# Patient Record
Sex: Female | Born: 1990 | Race: White | Hispanic: No | Marital: Single | Smoking: Former smoker
Health system: Southern US, Community
[De-identification: ages and names within clinical notes are randomized; demographics above are authoritative.]

## PROBLEM LIST (undated history)

## (undated) DIAGNOSIS — J45909 Unspecified asthma, uncomplicated: Secondary | ICD-10-CM

## (undated) HISTORY — PX: APPENDECTOMY: SHX54

---
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
## 2015-07-08 ENCOUNTER — Encounter (HOSPITAL_COMMUNITY): Payer: Self-pay | Admitting: Emergency Medicine

## 2015-07-08 ENCOUNTER — Emergency Department (HOSPITAL_COMMUNITY)
Admission: EM | Admit: 2015-07-08 | Discharge: 2015-07-08 | Disposition: A | Discharge status: Home / Self Care | Payer: BLUE CROSS/BLUE SHIELD | Attending: Emergency Medicine | Admitting: Emergency Medicine

## 2015-07-08 DIAGNOSIS — R21 Rash and other nonspecific skin eruption: Secondary | ICD-10-CM

## 2015-07-08 DIAGNOSIS — J45909 Unspecified asthma, uncomplicated: Secondary | ICD-10-CM | POA: Insufficient documentation

## 2015-07-08 DIAGNOSIS — Z87891 Personal history of nicotine dependence: Secondary | ICD-10-CM | POA: Insufficient documentation

## 2015-07-08 HISTORY — DX: Unspecified asthma, uncomplicated: J45.909

## 2015-07-08 MED ORDER — PREDNISONE 20 MG PO TABS
40.0000 mg | ORAL_TABLET | Freq: Every day | ORAL | Status: AC
Start: 2015-07-08 — End: ?

## 2015-07-08 MED ORDER — SULFAMETHOXAZOLE-TRIMETHOPRIM 800-160 MG PO TABS: 1.0000 | ORAL_TABLET | Freq: Two times a day (BID) | ORAL | Status: AC

## 2015-07-08 MED ORDER — HYDROXYZINE HCL 25 MG PO TABS: 25.0000 mg | ORAL_TABLET | Freq: Four times a day (QID) | ORAL | Status: AC

## 2015-07-08 NOTE — ED Provider Notes (Signed)
CSN: [id number]     Arrival date & time 07/08/15  0102 History   First MD Initiated Contact with Patient 07/08/15 [phone]     Chief Complaint  Patient presents with  . Rash     (Consider location/radiation/quality/duration/timing/severity/associated sxs/prior Treatment) HPI Comments: Patient is a 24 year old female with a past medical history of asthma who presents with rash that started 1 week ago. The rash started gradually and progressively worsened since the onset. The rash is located on her generalized body. Patient has tried triamcinolone without relief. Patient denies new exposures to medications, soaps, lotions, detergent. Patient reports associated occasional itching. No aggravating/alleviating factors. Patient denies fever, chills, NVD, sore throat, oral lesions, ocular involvement, throat closing, wheezing, SOB, chest pain, abdominal pain.     Patient is a 24 y.o. female presenting with rash.  Rash Associated symptoms: no abdominal pain, no diarrhea, no fatigue, no fever, no joint pain, no nausea, no shortness of breath and not vomiting     Past Medical History  Diagnosis Date  . Asthma    Past Surgical History  Procedure Laterality Date  . Appendectomy     No family history on file. History  Substance Use Topics  . Smoking status: Former Games developer  . Smokeless tobacco: Not on file  . Alcohol Use: Yes     Comment: social   OB History    No data available     Review of Systems  Constitutional: Negative for fever, chills and fatigue.  HENT: Negative for trouble swallowing.   Eyes: Negative for visual disturbance.  Respiratory: Negative for shortness of breath.   Cardiovascular: Negative for chest pain and palpitations.  Gastrointestinal: Negative for nausea, vomiting, abdominal pain and diarrhea.  Genitourinary: Negative for dysuria and difficulty urinating.  Musculoskeletal: Negative for arthralgias and neck pain.  Skin: Positive for rash. Negative for color  change.  Neurological: Negative for dizziness and weakness.  Psychiatric/Behavioral: Negative for dysphoric mood.      Allergies  Review of patient's allergies indicates not on file.  Home Medications   Prior to Admission medications   Not on File   BP 118/72 mmHg  Pulse 78  Temp(Src) 98 F (36.7 C) (Oral)  Resp 18  Wt 217 lb 6.4 oz (98.612 kg)  SpO2 98%  LMP 06/04/2015 Physical Exam  Constitutional: She appears well-developed and well-nourished. No distress.  HENT:  Head: Normocephalic and atraumatic.  Eyes: Conjunctivae and EOM are normal.  Neck: Normal range of motion.  Cardiovascular: Normal rate and regular rhythm.  Exam reveals no gallop and no friction rub.   No murmur heard. Pulmonary/Chest: Effort normal and breath sounds normal. She has no wheezes. She has no rales. She exhibits no tenderness.  Abdominal: Soft. She exhibits no distension. There is no tenderness. There is no rebound.  Musculoskeletal: Normal range of motion.  Neurological: She is alert.  Speech is goal-oriented. Moves limbs without ataxia.   Skin: Skin is warm and dry.  Scatted papules with surrounding erythema and overlying scabs on generalized body that are concentrated on left fourth finger, right wrist, right great toe and ankle.   Psychiatric: She has a normal mood and affect. Her behavior is normal.  Nursing note and vitals reviewed.   ED Course  Procedures (including critical care time) Labs Review Labs Reviewed - No data to display  Imaging Review No results found.   EKG Interpretation None      MDM   Final diagnoses:  Rash  1:45 AM Patient has nonspecific painful skin eruption and will be treated with prednisone and bactrim and atarax. Vitals stable and patient afebrile. Patient instructed to return with worsening or concerning symptoms. Does not have the appearance of bed bugs or scabies at this time.    Emilia Beck, PA-C 07/08/15 0152  Layla Maw Ward,  DO 07/08/15 0202

## 2015-07-08 NOTE — ED Notes (Signed)
Pt c/o rash for several days with itching.

## 2015-07-08 NOTE — Discharge Instructions (Signed)
Take bactrim as directed until gone. Take prednisone as directed until gone. Take atarax as needed for itching.

## 2015-07-08 NOTE — ED Notes (Signed)
Pt left with belongings and ambulated out of treatment area.  

## 2022-03-27 ENCOUNTER — Other Ambulatory Visit: Payer: Self-pay | Admitting: Physician Assistant

## 2022-03-27 ENCOUNTER — Ambulatory Visit (INDEPENDENT_AMBULATORY_CARE_PROVIDER_SITE_OTHER): Payer: Self-pay

## 2022-03-27 DIAGNOSIS — R52 Pain, unspecified: Secondary | ICD-10-CM

## 2022-03-27 DIAGNOSIS — S9781XA Crushing injury of right foot, initial encounter: Secondary | ICD-10-CM

## 2022-10-06 ENCOUNTER — Ambulatory Visit (INDEPENDENT_AMBULATORY_CARE_PROVIDER_SITE_OTHER): Payer: Self-pay

## 2022-10-06 ENCOUNTER — Other Ambulatory Visit: Payer: Self-pay | Admitting: Family

## 2022-10-06 DIAGNOSIS — R52 Pain, unspecified: Secondary | ICD-10-CM

## 2022-10-06 DIAGNOSIS — S93401A Sprain of unspecified ligament of right ankle, initial encounter: Secondary | ICD-10-CM

## 2023-07-29 IMAGING — DX DG FOOT COMPLETE 3+V*R*
3 series · 3 of 3 positions shown · non-contrast
Comparison: Right ankle radiographs 03/13/2006

CLINICAL DATA: Crush injury to right foot, pain and bruising of
first metatarsal

EXAM:
RIGHT FOOT COMPLETE - 3+ VIEW

[foot ap]
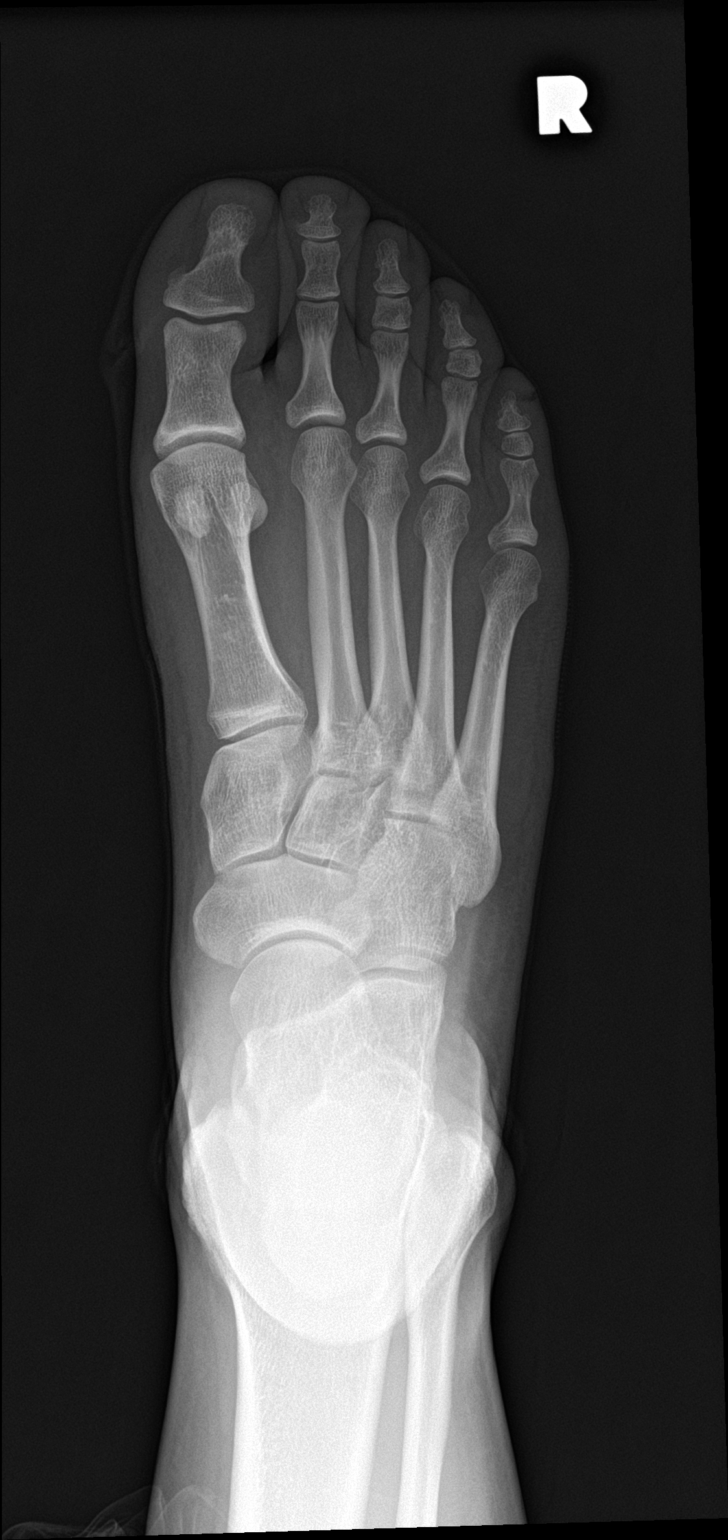

[foot obl]
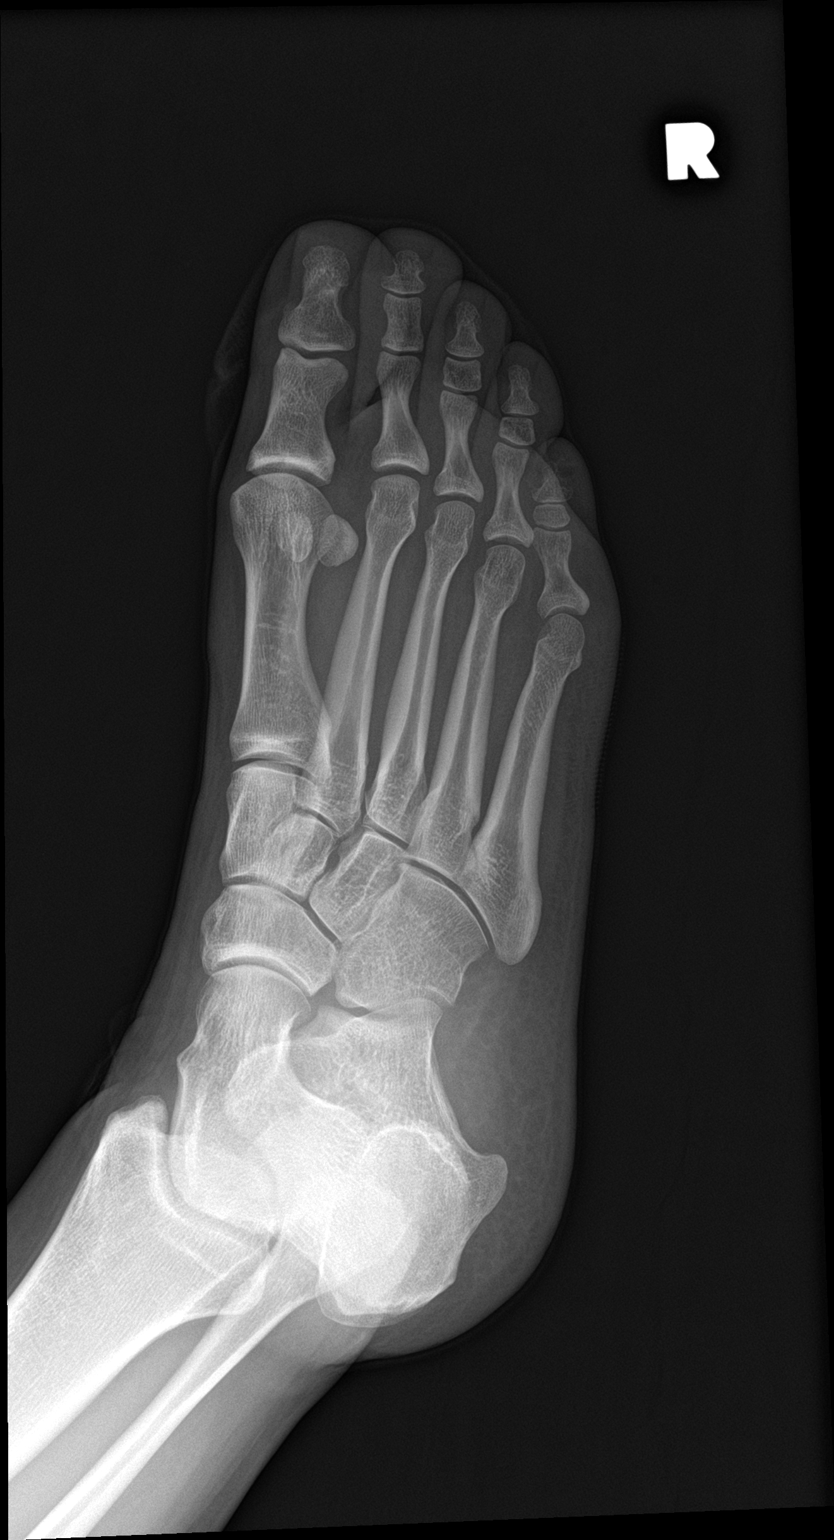

[foot lat]
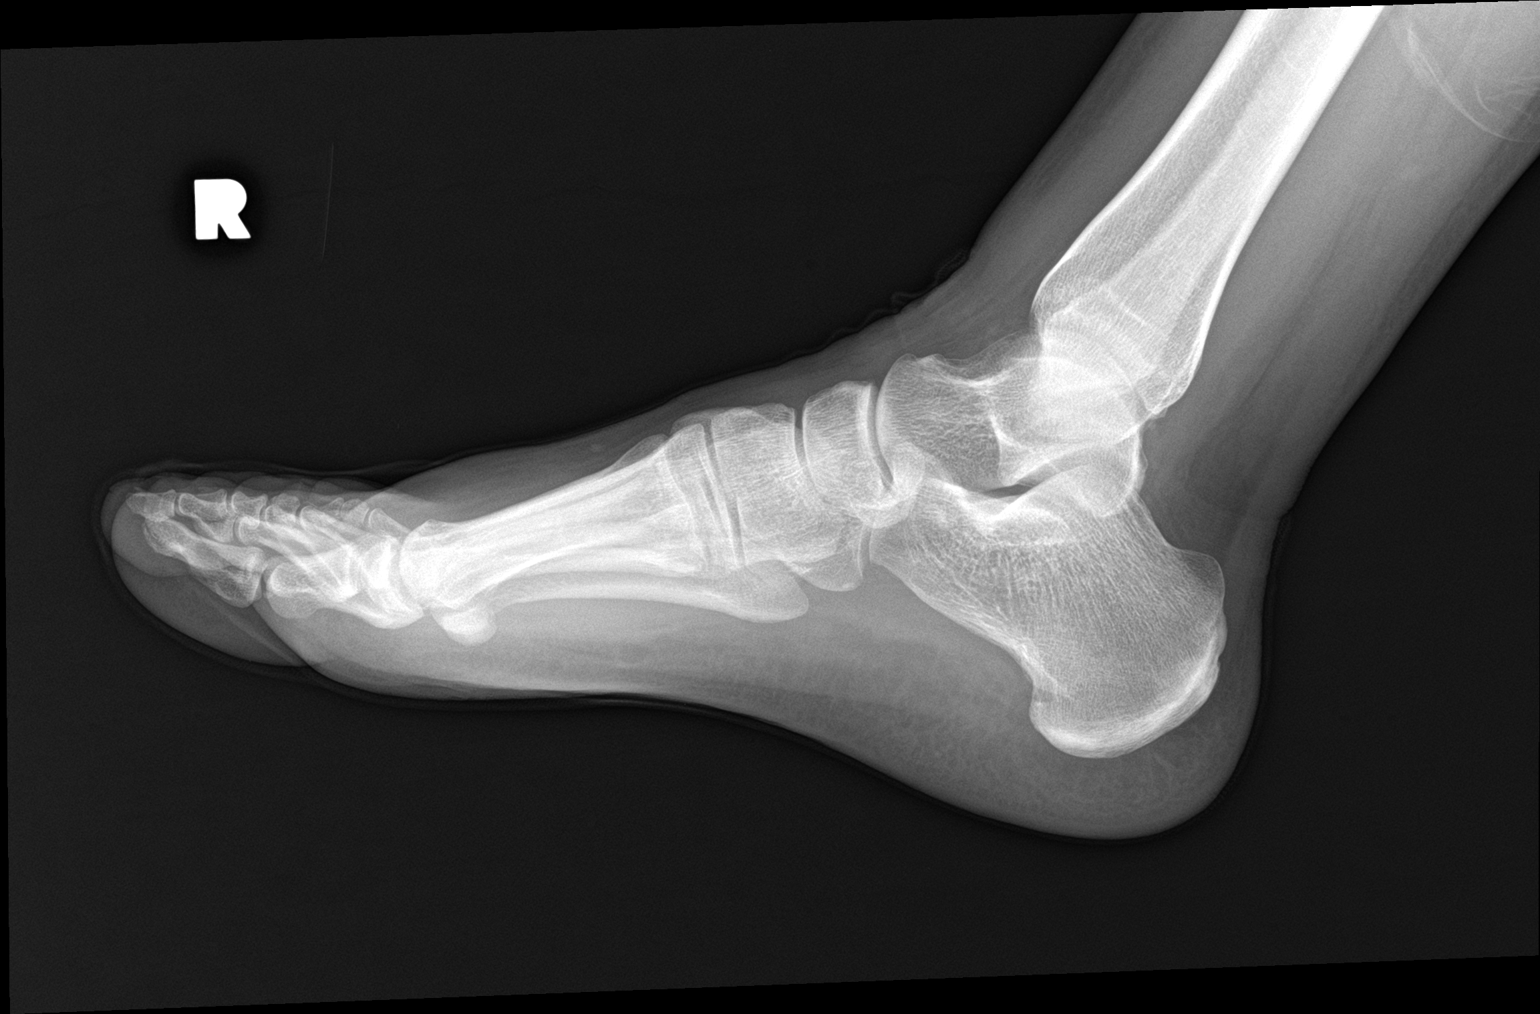

[3 of 3 positions shown; findings below may reference images not displayed]

FINDINGS: There is no acute fracture or dislocation. Bony alignment is normal.
The Lisfranc and Chopart joints are intact. The soft tissues are
unremarkable.
IMPRESSION: Negative.
